# Patient Record
Sex: Male | Born: 1980 | Race: Black or African American | Hispanic: No | Marital: Single | Smoking: Current every day smoker
Health system: Southern US, Community
[De-identification: ages and names within clinical notes are randomized; demographics above are authoritative.]

## PROBLEM LIST (undated history)

## (undated) DIAGNOSIS — Z789 Other specified health status: Secondary | ICD-10-CM

## (undated) HISTORY — PX: ADENOIDECTOMY: SUR15

---

## 2016-06-03 ENCOUNTER — Encounter (HOSPITAL_COMMUNITY): Admission: EM | Disposition: A | Discharge status: Home / Self Care | Payer: Self-pay | Source: Home / Self Care

## 2016-06-03 ENCOUNTER — Emergency Department (HOSPITAL_COMMUNITY): Payer: No Typology Code available for payment source | Admitting: Anesthesiology

## 2016-06-03 ENCOUNTER — Inpatient Hospital Stay (HOSPITAL_COMMUNITY)
Admission: EM | Admit: 2016-06-03 | Discharge: 2016-06-09 | DRG: 580 | Disposition: A | Discharge status: Home / Self Care | Payer: No Typology Code available for payment source | Attending: Surgery | Admitting: Surgery

## 2016-06-03 ENCOUNTER — Encounter (HOSPITAL_COMMUNITY): Payer: Self-pay

## 2016-06-03 ENCOUNTER — Emergency Department (HOSPITAL_COMMUNITY): Payer: No Typology Code available for payment source

## 2016-06-03 ENCOUNTER — Other Ambulatory Visit: Payer: Self-pay

## 2016-06-03 DIAGNOSIS — R7989 Other specified abnormal findings of blood chemistry: Secondary | ICD-10-CM

## 2016-06-03 DIAGNOSIS — S31113A Laceration without foreign body of abdominal wall, right lower quadrant without penetration into peritoneal cavity, initial encounter: Secondary | ICD-10-CM | POA: Diagnosis present

## 2016-06-03 DIAGNOSIS — F17210 Nicotine dependence, cigarettes, uncomplicated: Secondary | ICD-10-CM | POA: Diagnosis present

## 2016-06-03 DIAGNOSIS — S31119A Laceration without foreign body of abdominal wall, unspecified quadrant without penetration into peritoneal cavity, initial encounter: Secondary | ICD-10-CM | POA: Diagnosis present

## 2016-06-03 DIAGNOSIS — R740 Nonspecific elevation of levels of transaminase and lactic acid dehydrogenase [LDH]: Secondary | ICD-10-CM | POA: Diagnosis present

## 2016-06-03 DIAGNOSIS — Y908 Blood alcohol level of 240 mg/100 ml or more: Secondary | ICD-10-CM | POA: Diagnosis present

## 2016-06-03 DIAGNOSIS — T148XXA Other injury of unspecified body region, initial encounter: Secondary | ICD-10-CM | POA: Diagnosis present

## 2016-06-03 DIAGNOSIS — F10120 Alcohol abuse with intoxication, uncomplicated: Secondary | ICD-10-CM | POA: Diagnosis present

## 2016-06-03 DIAGNOSIS — N179 Acute kidney failure, unspecified: Secondary | ICD-10-CM | POA: Diagnosis not present

## 2016-06-03 DIAGNOSIS — S36899A Unspecified injury of other intra-abdominal organs, initial encounter: Secondary | ICD-10-CM | POA: Diagnosis present

## 2016-06-03 DIAGNOSIS — F10920 Alcohol use, unspecified with intoxication, uncomplicated: Secondary | ICD-10-CM

## 2016-06-03 DIAGNOSIS — D62 Acute posthemorrhagic anemia: Secondary | ICD-10-CM

## 2016-06-03 HISTORY — PX: LAPAROTOMY: SHX154

## 2016-06-03 HISTORY — PX: EXPLORATORY LAPAROTOMY: SUR591

## 2016-06-03 HISTORY — DX: Other specified health status: Z78.9

## 2016-06-03 LAB — PREPARE FRESH FROZEN PLASMA
UNIT DIVISION: 0
Unit division: 0

## 2016-06-03 LAB — COMPREHENSIVE METABOLIC PANEL
ALBUMIN: 3.8 g/dL (ref 3.5–5.0)
ALBUMIN: 3.8 g/dL (ref 3.5–5.0)
ALK PHOS: 49 U/L (ref 38–126)
ALT: 18 U/L (ref 17–63)
ALT: 17 U/L (ref 17–63)
ANION GAP: 9 (ref 5–15)
ANION GAP: 9 (ref 5–15)
AST: 21 U/L (ref 15–41)
AST: 22 U/L (ref 15–41)
Alkaline Phosphatase: 51 U/L (ref 38–126)
BUN: 11 mg/dL (ref 6–20)
BUN: 10 mg/dL (ref 6–20)
CALCIUM: 8.3 mg/dL — AB (ref 8.9–10.3)
CHLORIDE: 105 mmol/L (ref 101–111)
CHLORIDE: 103 mmol/L (ref 101–111)
CO2: 24 mmol/L (ref 22–32)
CO2: 27 mmol/L (ref 22–32)
Calcium: 8.7 mg/dL — ABNORMAL LOW (ref 8.9–10.3)
Creatinine, Ser: 1.21 mg/dL (ref 0.61–1.24)
Creatinine, Ser: 1.21 mg/dL (ref 0.61–1.24)
GFR calc non Af Amer: >60 mL/min (ref >60)
GFR calc non Af Amer: >60 mL/min (ref >60)
GLUCOSE: 148 mg/dL — AB (ref 65–99)
Glucose, Bld: 113 mg/dL — ABNORMAL HIGH (ref 65–99)
POTASSIUM: 3.9 mmol/L (ref 3.5–5.1)
Potassium: 3.5 mmol/L (ref 3.5–5.1)
SODIUM: 138 mmol/L (ref 135–145)
SODIUM: 139 mmol/L (ref 135–145)
Total Bilirubin: 0.3 mg/dL (ref 0.3–1.2)
Total Bilirubin: 0.2 mg/dL — ABNORMAL LOW (ref 0.3–1.2)
Total Protein: 6.9 g/dL (ref 6.5–8.1)
Total Protein: 6.8 g/dL (ref 6.5–8.1)

## 2016-06-03 LAB — BPAM FFP
BLOOD PRODUCT EXPIRATION DATE: 201805302359
Blood Product Expiration Date: 201805302359
ISSUE DATE / TIME: 201805280055
ISSUE DATE / TIME: 201805280055
UNIT TYPE AND RH: 6200
Unit Type and Rh: 6200

## 2016-06-03 LAB — I-STAT CHEM 8, ED
BUN: 14 mg/dL (ref 6–20)
CALCIUM ION: 1.02 mmol/L — AB (ref 1.15–1.40)
Chloride: 103 mmol/L (ref 101–111)
Creatinine, Ser: 1.5 mg/dL — ABNORMAL HIGH (ref 0.61–1.24)
GLUCOSE: 110 mg/dL — AB (ref 65–99)
HEMATOCRIT: 39 % (ref 39.0–52.0)
HEMOGLOBIN: 13.3 g/dL (ref 13.0–17.0)
POTASSIUM: 4.1 mmol/L (ref 3.5–5.1)
Sodium: 140 mmol/L (ref 135–145)
TCO2: 26 mmol/L (ref 0–100)

## 2016-06-03 LAB — ETHANOL: Alcohol, Ethyl (B): 250 mg/dL — ABNORMAL HIGH (ref <5)

## 2016-06-03 LAB — CBC
HCT: 37.7 % — ABNORMAL LOW (ref 39.0–52.0)
HEMOGLOBIN: 12.2 g/dL — AB (ref 13.0–17.0)
MCH: 23.7 pg — AB (ref 26.0–34.0)
MCHC: 32.4 g/dL (ref 30.0–36.0)
MCV: 73.2 fL — ABNORMAL LOW (ref 78.0–100.0)
PLATELETS: 151 10*3/uL (ref 150–400)
RBC: 5.15 MIL/uL (ref 4.22–5.81)
RDW: 15 % (ref 11.5–15.5)
WBC: 12 10*3/uL — ABNORMAL HIGH (ref 4.0–10.5)

## 2016-06-03 LAB — PROTIME-INR
INR: 0.98
Prothrombin Time: 13 seconds (ref 11.4–15.2)

## 2016-06-03 LAB — I-STAT CG4 LACTIC ACID, ED: LACTIC ACID, VENOUS: 2.67 mmol/L — AB (ref 0.5–1.9)

## 2016-06-03 LAB — BLOOD PRODUCT ORDER (VERBAL) VERIFICATION

## 2016-06-03 LAB — HIV ANTIBODY (ROUTINE TESTING W REFLEX): HIV SCREEN 4TH GENERATION: NONREACTIVE

## 2016-06-03 LAB — CDS SEROLOGY

## 2016-06-03 LAB — ABO/RH: ABO/RH(D): B POS

## 2016-06-03 SURGERY — LAPAROTOMY, EXPLORATORY
Anesthesia: General | Site: Abdomen

## 2016-06-03 MED ORDER — MIDAZOLAM HCL 2 MG/2ML IJ SOLN: 0.5000 mg | Freq: Once | INTRAMUSCULAR | Status: DC | PRN

## 2016-06-03 MED ORDER — KCL IN DEXTROSE-NACL 20-5-0.9 MEQ/L-%-% IV SOLN
INTRAVENOUS | Status: DC
  Administered 2016-06-03 – 2016-06-06 (×9): via INTRAVENOUS
  Administered 2016-06-07: 10 mL/h via INTRAVENOUS
  Filled 2016-06-03 (×13): qty 1000

## 2016-06-03 MED ORDER — MEPERIDINE HCL 25 MG/ML IJ SOLN: 6.2500 mg | INTRAMUSCULAR | Status: DC | PRN

## 2016-06-03 MED ORDER — HYDROMORPHONE HCL 1 MG/ML IJ SOLN
0.2500 mg | INTRAMUSCULAR | Status: DC | PRN
  Administered 2016-06-03 (×2): 0.5 mg via INTRAVENOUS

## 2016-06-03 MED ORDER — WHITE PETROLATUM GEL
Status: AC
  Administered 2016-06-03: 13:00:00
  Filled 2016-06-03: qty 1

## 2016-06-03 MED ORDER — ONDANSETRON HCL 4 MG/2ML IJ SOLN
INTRAMUSCULAR | Status: DC | PRN
  Administered 2016-06-03: 4 mg via INTRAVENOUS

## 2016-06-03 MED ORDER — PROPOFOL 10 MG/ML IV BOLUS
INTRAVENOUS | Status: DC | PRN
  Administered 2016-06-03: 140 mg via INTRAVENOUS

## 2016-06-03 MED ORDER — ONDANSETRON HCL 4 MG/2ML IJ SOLN
INTRAMUSCULAR | Status: AC
  Filled 2016-06-03: qty 2

## 2016-06-03 MED ORDER — CEFAZOLIN SODIUM 1 G IJ SOLR
INTRAMUSCULAR | Status: AC
  Filled 2016-06-03: qty 20

## 2016-06-03 MED ORDER — FENTANYL CITRATE (PF) 250 MCG/5ML IJ SOLN
INTRAMUSCULAR | Status: DC | PRN
  Administered 2016-06-03: 150 ug via INTRAVENOUS

## 2016-06-03 MED ORDER — ONDANSETRON HCL 4 MG/2ML IJ SOLN
4.0000 mg | Freq: Four times a day (QID) | INTRAMUSCULAR | Status: DC | PRN
  Administered 2016-06-04 – 2016-06-05 (×2): 4 mg via INTRAVENOUS

## 2016-06-03 MED ORDER — EPHEDRINE SULFATE 50 MG/ML IJ SOLN
INTRAMUSCULAR | Status: DC | PRN
  Administered 2016-06-03: 10 mg via INTRAVENOUS

## 2016-06-03 MED ORDER — TETANUS-DIPHTH-ACELL PERTUSSIS 5-2.5-18.5 LF-MCG/0.5 IM SUSP: 0.5000 mL | Freq: Once | INTRAMUSCULAR | Status: DC

## 2016-06-03 MED ORDER — EPHEDRINE 5 MG/ML INJ
INTRAVENOUS | Status: AC
  Filled 2016-06-03: qty 10

## 2016-06-03 MED ORDER — ONDANSETRON HCL 4 MG PO TABS: 4.0000 mg | ORAL_TABLET | Freq: Four times a day (QID) | ORAL | Status: DC | PRN

## 2016-06-03 MED ORDER — LIDOCAINE 2% (20 MG/ML) 5 ML SYRINGE
INTRAMUSCULAR | Status: AC
  Filled 2016-06-03: qty 5

## 2016-06-03 MED ORDER — ONDANSETRON HCL 4 MG/2ML IJ SOLN
4.0000 mg | Freq: Four times a day (QID) | INTRAMUSCULAR | Status: DC | PRN
  Filled 2016-06-03 (×3): qty 2

## 2016-06-03 MED ORDER — HYDROMORPHONE 1 MG/ML IV SOLN
INTRAVENOUS | Status: DC
  Administered 2016-06-03: 1 mg via INTRAVENOUS
  Administered 2016-06-03: 0.4 mg via INTRAVENOUS
  Administered 2016-06-03: 0.2 mg via INTRAVENOUS
  Administered 2016-06-03: 03:00:00 via INTRAVENOUS
  Administered 2016-06-04: 0.4 mg via INTRAVENOUS
  Administered 2016-06-04: 0.6 mg via INTRAVENOUS
  Administered 2016-06-04: 0.2 mg via INTRAVENOUS
  Administered 2016-06-04: 0.4 mg via INTRAVENOUS
  Administered 2016-06-04: 2.6 mg via INTRAVENOUS
  Administered 2016-06-04 – 2016-06-05 (×2): 0.2 mg via INTRAVENOUS
  Administered 2016-06-05: 0.4 mg via INTRAVENOUS
  Filled 2016-06-03: qty 25

## 2016-06-03 MED ORDER — NALOXONE HCL 0.4 MG/ML IJ SOLN: 0.4000 mg | INTRAMUSCULAR | Status: DC | PRN

## 2016-06-03 MED ORDER — ROCURONIUM BROMIDE 10 MG/ML (PF) SYRINGE
PREFILLED_SYRINGE | INTRAVENOUS | Status: AC
  Filled 2016-06-03: qty 5

## 2016-06-03 MED ORDER — HYDROMORPHONE HCL 1 MG/ML IJ SOLN
INTRAMUSCULAR | Status: AC
  Administered 2016-06-03: 0.5 mg via INTRAVENOUS
  Filled 2016-06-03: qty 1

## 2016-06-03 MED ORDER — FENTANYL CITRATE (PF) 250 MCG/5ML IJ SOLN
INTRAMUSCULAR | Status: AC
  Filled 2016-06-03: qty 5

## 2016-06-03 MED ORDER — SUCCINYLCHOLINE CHLORIDE 200 MG/10ML IV SOSY
PREFILLED_SYRINGE | INTRAVENOUS | Status: AC
  Filled 2016-06-03: qty 10

## 2016-06-03 MED ORDER — SUGAMMADEX SODIUM 200 MG/2ML IV SOLN
INTRAVENOUS | Status: DC | PRN
  Administered 2016-06-03: 200 mg via INTRAVENOUS

## 2016-06-03 MED ORDER — DEXAMETHASONE SODIUM PHOSPHATE 10 MG/ML IJ SOLN
INTRAMUSCULAR | Status: DC | PRN
  Administered 2016-06-03: 10 mg via INTRAVENOUS

## 2016-06-03 MED ORDER — SUGAMMADEX SODIUM 200 MG/2ML IV SOLN
INTRAVENOUS | Status: AC
  Filled 2016-06-03: qty 2

## 2016-06-03 MED ORDER — DIPHENHYDRAMINE HCL 50 MG/ML IJ SOLN: 12.5000 mg | Freq: Four times a day (QID) | INTRAMUSCULAR | Status: DC | PRN

## 2016-06-03 MED ORDER — LACTATED RINGERS IV SOLN
INTRAVENOUS | Status: DC | PRN
  Administered 2016-06-03: 01:00:00 via INTRAVENOUS

## 2016-06-03 MED ORDER — MIDAZOLAM HCL 2 MG/2ML IJ SOLN
INTRAMUSCULAR | Status: AC
  Filled 2016-06-03: qty 2

## 2016-06-03 MED ORDER — SODIUM CHLORIDE 0.9 % IV SOLN
INTRAVENOUS | Status: DC | PRN
  Administered 2016-06-03: 02:00:00 via INTRAVENOUS

## 2016-06-03 MED ORDER — SODIUM CHLORIDE 0.9% FLUSH: 9.0000 mL | INTRAVENOUS | Status: DC | PRN

## 2016-06-03 MED ORDER — 0.9 % SODIUM CHLORIDE (POUR BTL) OPTIME
TOPICAL | Status: DC | PRN
  Administered 2016-06-03: 1000 mL

## 2016-06-03 MED ORDER — PROMETHAZINE HCL 25 MG/ML IJ SOLN: 6.2500 mg | INTRAMUSCULAR | Status: DC | PRN

## 2016-06-03 MED ORDER — DEXAMETHASONE SODIUM PHOSPHATE 10 MG/ML IJ SOLN
INTRAMUSCULAR | Status: AC
  Filled 2016-06-03: qty 1

## 2016-06-03 MED ORDER — MIDAZOLAM HCL 5 MG/5ML IJ SOLN
INTRAMUSCULAR | Status: DC | PRN
  Administered 2016-06-03: 2 mg via INTRAVENOUS

## 2016-06-03 MED ORDER — PROPOFOL 10 MG/ML IV BOLUS
INTRAVENOUS | Status: AC
  Filled 2016-06-03: qty 20

## 2016-06-03 MED ORDER — ENOXAPARIN SODIUM 40 MG/0.4ML ~~LOC~~ SOLN
40.0000 mg | SUBCUTANEOUS | Status: DC
  Administered 2016-06-04 – 2016-06-08 (×5): 40 mg via SUBCUTANEOUS
  Filled 2016-06-03 (×6): qty 0.4

## 2016-06-03 MED ORDER — CEFAZOLIN SODIUM 1 G IJ SOLR
INTRAMUSCULAR | Status: DC | PRN
  Administered 2016-06-03: 2 g via INTRAMUSCULAR

## 2016-06-03 MED ORDER — SUCCINYLCHOLINE CHLORIDE 20 MG/ML IJ SOLN
INTRAMUSCULAR | Status: DC | PRN
  Administered 2016-06-03: 140 mg via INTRAVENOUS

## 2016-06-03 MED ORDER — HYDROMORPHONE HCL 1 MG/ML IJ SOLN: 1.0000 mg | INTRAMUSCULAR | Status: DC | PRN

## 2016-06-03 MED ORDER — ROCURONIUM BROMIDE 100 MG/10ML IV SOLN
INTRAVENOUS | Status: DC | PRN
Start: 2016-06-03 — End: 2016-06-03
  Administered 2016-06-03: 30 mg via INTRAVENOUS

## 2016-06-03 MED ORDER — DIPHENHYDRAMINE HCL 12.5 MG/5ML PO ELIX: 12.5000 mg | ORAL_SOLUTION | Freq: Four times a day (QID) | ORAL | Status: DC | PRN

## 2016-06-03 MED ORDER — LIDOCAINE HCL (CARDIAC) 20 MG/ML IV SOLN
INTRAVENOUS | Status: DC | PRN
  Administered 2016-06-03: 100 mg via INTRATRACHEAL

## 2016-06-03 SURGICAL SUPPLY — 41 items
BLADE CLIPPER SURG (BLADE) IMPLANT
CANISTER SUCT 3000ML PPV (MISCELLANEOUS) ×3 IMPLANT
CHLORAPREP W/TINT 26ML (MISCELLANEOUS) ×3 IMPLANT
COVER SURGICAL LIGHT HANDLE (MISCELLANEOUS) ×3 IMPLANT
DRAPE LAPAROSCOPIC ABDOMINAL (DRAPES) ×3 IMPLANT
DRAPE WARM FLUID 44X44 (DRAPE) ×3 IMPLANT
DRSG OPSITE POSTOP 3X4 (GAUZE/BANDAGES/DRESSINGS) ×3 IMPLANT
DRSG OPSITE POSTOP 4X10 (GAUZE/BANDAGES/DRESSINGS) ×3 IMPLANT
DRSG OPSITE POSTOP 4X8 (GAUZE/BANDAGES/DRESSINGS) IMPLANT
ELECT BLADE 6.5 EXT (BLADE) ×3 IMPLANT
ELECT CAUTERY BLADE 6.4 (BLADE) ×3 IMPLANT
ELECT REM PT RETURN 9FT ADLT (ELECTROSURGICAL) ×3
ELECTRODE REM PT RTRN 9FT ADLT (ELECTROSURGICAL) ×1 IMPLANT
GLOVE BIO SURGEON STRL SZ8 (GLOVE) ×3 IMPLANT
GLOVE BIOGEL PI IND STRL 8 (GLOVE) ×1 IMPLANT
GLOVE BIOGEL PI INDICATOR 8 (GLOVE) ×2
GOWN STRL REUS W/ TWL LRG LVL3 (GOWN DISPOSABLE) ×1 IMPLANT
GOWN STRL REUS W/ TWL XL LVL3 (GOWN DISPOSABLE) ×1 IMPLANT
GOWN STRL REUS W/TWL LRG LVL3 (GOWN DISPOSABLE) ×2
GOWN STRL REUS W/TWL XL LVL3 (GOWN DISPOSABLE) ×2
HANDLE SUCTION POOLE (INSTRUMENTS) ×1 IMPLANT
KIT BASIN OR (CUSTOM PROCEDURE TRAY) ×3 IMPLANT
KIT ROOM TURNOVER OR (KITS) ×3 IMPLANT
LIGASURE IMPACT 36 18CM CVD LR (INSTRUMENTS) IMPLANT
NS IRRIG 1000ML POUR BTL (IV SOLUTION) ×6 IMPLANT
PACK GENERAL/GYN (CUSTOM PROCEDURE TRAY) ×3 IMPLANT
PAD ARMBOARD 7.5X6 YLW CONV (MISCELLANEOUS) ×3 IMPLANT
SPECIMEN JAR LARGE (MISCELLANEOUS) IMPLANT
SPONGE LAP 18X18 X RAY DECT (DISPOSABLE) ×3 IMPLANT
STAPLER VISISTAT 35W (STAPLE) ×6 IMPLANT
SUCTION POOLE HANDLE (INSTRUMENTS) ×3
SUCTION POOLE TIP (SUCTIONS) ×3 IMPLANT
SUT PDS AB 1 TP1 96 (SUTURE) ×12 IMPLANT
SUT VIC AB 2-0 SH 18 (SUTURE) ×6 IMPLANT
SUT VIC AB 3-0 SH 18 (SUTURE) ×6 IMPLANT
SUT VICRYL AB 2 0 TIES (SUTURE) ×3 IMPLANT
SUT VICRYL AB 3 0 TIES (SUTURE) ×3 IMPLANT
TOWEL OR 17X24 6PK STRL BLUE (TOWEL DISPOSABLE) ×3 IMPLANT
TOWEL OR 17X26 10 PK STRL BLUE (TOWEL DISPOSABLE) ×3 IMPLANT
TRAY FOLEY W/METER SILVER 16FR (SET/KITS/TRAYS/PACK) IMPLANT
YANKAUER SUCT BULB TIP NO VENT (SUCTIONS) IMPLANT
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
## 2016-06-03 NOTE — ED Notes (Signed)
Cronett at bedside
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
## 2016-06-03 NOTE — ED Notes (Signed)
Valuables loced up with security, belongings sent with patient
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
## 2016-06-03 NOTE — Anesthesia Postprocedure Evaluation (Signed)
Anesthesia Post Note  Patient: Lawrence Curry  Procedure(s) Performed: Procedure(s) (LRB): EXPLORATORY LAPAROTOMY (N/A)  Patient location during evaluation: PACU Anesthesia Type: General Level of consciousness: sedated, oriented and patient cooperative Pain management: pain level controlled Vital Signs Assessment: post-procedure vital signs reviewed and stable Respiratory status: spontaneous breathing, nonlabored ventilation, respiratory function stable and patient connected to nasal cannula oxygen Cardiovascular status: blood pressure returned to baseline and stable Postop Assessment: no signs of nausea or vomiting Anesthetic complications: no       Last Vitals:  Vitals:   06/03/16 0345 06/03/16 0405  BP: 136/80 135/76  Pulse:  83  Resp:  17  Temp: 36.3 C 37 C    Last Pain:  Vitals:   06/03/16 0405  TempSrc: Oral  PainSc:                  Teale Goodgame,E. Cate Oravec
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
## 2016-06-03 NOTE — Transfer of Care (Signed)
Immediate Anesthesia Transfer of Care Note  Patient: Lawrence Curry  Procedure(s) Performed: Procedure(s): EXPLORATORY LAPAROTOMY (N/A)  Patient Location: PACU  Anesthesia Type:General  Level of Consciousness: sedated, drowsy and confused  Airway & Oxygen Therapy: Patient Spontanous Breathing and Patient connected to nasal cannula oxygen  Post-op Assessment: Report given to RN, Post -op Vital signs reviewed and stable and Patient moving all extremities X 4  Post vital signs: Reviewed and stable  Last Vitals:  Vitals:   06/03/16 0113 06/03/16 0115  BP: 127/89 105/66  Pulse: 85 74  Resp: (!) 21 20  Temp:      Last Pain:  Vitals:   06/03/16 0109  PainSc: 7          Complications: No apparent anesthesia complications

## 2016-06-03 NOTE — Anesthesia Procedure Notes (Signed)
Procedure Name: Intubation Date/Time: 06/03/2016 1:32 AM Performed by: Claris Che Pre-anesthesia Checklist: Patient identified, Emergency Drugs available, Suction available, Patient being monitored and Timeout performed Patient Re-evaluated:Patient Re-evaluated prior to inductionOxygen Delivery Method: Circle system utilized Preoxygenation: Pre-oxygenation with 100% oxygen Intubation Type: IV induction, Rapid sequence and Cricoid Pressure applied Laryngoscope Size: Mac and 3 Grade View: Grade II Tube type: Oral Tube size: 8.0 mm Number of attempts: 1 Airway Equipment and Method: Stylet Placement Confirmation: ETT inserted through vocal cords under direct vision,  positive ETCO2 and breath sounds checked- equal and bilateral Secured at: 23 cm Tube secured with: Tape Dental Injury: Teeth and Oropharynx as per pre-operative assessment

## 2016-06-04 ENCOUNTER — Encounter (HOSPITAL_COMMUNITY): Payer: Self-pay | Admitting: Surgery

## 2016-06-04 LAB — BPAM RBC
Blood Product Expiration Date: 201806242359
Blood Product Expiration Date: 201806242359
ISSUE DATE / TIME: 201805280054
ISSUE DATE / TIME: 201805280054
UNIT TYPE AND RH: 9500
Unit Type and Rh: 9500

## 2016-06-04 LAB — URINALYSIS, ROUTINE W REFLEX MICROSCOPIC
BILIRUBIN URINE: NEGATIVE
Glucose, UA: NEGATIVE mg/dL
Hgb urine dipstick: NEGATIVE
KETONES UR: NEGATIVE mg/dL
Leukocytes, UA: NEGATIVE
NITRITE: NEGATIVE
PROTEIN: NEGATIVE mg/dL
Specific Gravity, Urine: 1.016 (ref 1.005–1.030)
pH: 6 (ref 5.0–8.0)

## 2016-06-04 LAB — TYPE AND SCREEN
ABO/RH(D): B POS
Antibody Screen: NEGATIVE
UNIT DIVISION: 0
Unit division: 0

## 2016-06-04 MED ORDER — ORAL CARE MOUTH RINSE
15.0000 mL | Freq: Two times a day (BID) | OROMUCOSAL | Status: DC
  Administered 2016-06-05 – 2016-06-07 (×4): 15 mL via OROMUCOSAL
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
## 2016-06-04 NOTE — Progress Notes (Signed)
Offered to ambulate with patient, he denied at this time due to pain. Will try again this afternoon.

## 2016-06-05 LAB — CBC WITH DIFFERENTIAL/PLATELET
BASOS PCT: 0 %
Basophils Absolute: 0 10*3/uL (ref 0.0–0.1)
EOS ABS: 0.1 10*3/uL (ref 0.0–0.7)
Eosinophils Relative: 1 %
HCT: 38.9 % — ABNORMAL LOW (ref 39.0–52.0)
HEMOGLOBIN: 12.3 g/dL — AB (ref 13.0–17.0)
Lymphocytes Relative: 25 %
Lymphs Abs: 1.9 10*3/uL (ref 0.7–4.0)
MCH: 23.5 pg — AB (ref 26.0–34.0)
MCHC: 31.6 g/dL (ref 30.0–36.0)
MCV: 74.2 fL — ABNORMAL LOW (ref 78.0–100.0)
MONO ABS: 0.6 10*3/uL (ref 0.1–1.0)
Monocytes Relative: 8 %
NEUTROS ABS: 5.1 10*3/uL (ref 1.7–7.7)
Neutrophils Relative %: 66 %
PLATELETS: 151 10*3/uL (ref 150–400)
RBC: 5.24 MIL/uL (ref 4.22–5.81)
RDW: 14.8 % (ref 11.5–15.5)
WBC: 7.7 10*3/uL (ref 4.0–10.5)

## 2016-06-05 LAB — BASIC METABOLIC PANEL
Anion gap: 9 (ref 5–15)
BUN: 5 mg/dL — AB (ref 6–20)
CHLORIDE: 101 mmol/L (ref 101–111)
CO2: 27 mmol/L (ref 22–32)
CREATININE: 1.08 mg/dL (ref 0.61–1.24)
Calcium: 9.2 mg/dL (ref 8.9–10.3)
GFR calc Af Amer: >60 mL/min (ref >60)
GFR calc non Af Amer: >60 mL/min (ref >60)
Glucose, Bld: 114 mg/dL — ABNORMAL HIGH (ref 65–99)
Potassium: 4.3 mmol/L (ref 3.5–5.1)
SODIUM: 137 mmol/L (ref 135–145)

## 2016-06-05 MED ORDER — METHOCARBAMOL 1000 MG/10ML IJ SOLN
500.0000 mg | Freq: Three times a day (TID) | INTRAVENOUS | Status: DC
  Administered 2016-06-05 – 2016-06-09 (×13): 500 mg via INTRAVENOUS
  Filled 2016-06-05 (×20): qty 5

## 2016-06-05 MED ORDER — KETOROLAC TROMETHAMINE 30 MG/ML IJ SOLN
30.0000 mg | Freq: Once | INTRAMUSCULAR | Status: AC
  Administered 2016-06-05: 30 mg via INTRAVENOUS
  Filled 2016-06-05: qty 1

## 2016-06-05 MED ORDER — PHENOL 1.4 % MT LIQD
1.0000 | OROMUCOSAL | Status: DC | PRN
  Administered 2016-06-05: 1 via OROMUCOSAL
  Filled 2016-06-05: qty 177

## 2016-06-05 MED ORDER — KETOROLAC TROMETHAMINE 15 MG/ML IJ SOLN
15.0000 mg | Freq: Four times a day (QID) | INTRAMUSCULAR | Status: AC
  Administered 2016-06-05 – 2016-06-07 (×8): 15 mg via INTRAVENOUS
  Filled 2016-06-05 (×8): qty 1

## 2016-06-05 MED ORDER — HYDROMORPHONE HCL 1 MG/ML IJ SOLN
1.0000 mg | INTRAMUSCULAR | Status: DC | PRN
  Administered 2016-06-05 – 2016-06-06 (×3): 1 mg via INTRAVENOUS
  Administered 2016-06-07: 2 mg via INTRAVENOUS
  Administered 2016-06-07: 1 mg via INTRAVENOUS
  Administered 2016-06-08: 2 mg via INTRAVENOUS
  Administered 2016-06-08: 1 mg via INTRAVENOUS
  Filled 2016-06-05 (×3): qty 1
  Filled 2016-06-05 (×2): qty 2
  Filled 2016-06-05 (×2): qty 1

## 2016-06-05 NOTE — Progress Notes (Signed)
NGT clamped. Advised pt to call at the s/o abd discomfort, distention. N&v.
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
## 2016-06-05 NOTE — Progress Notes (Signed)
Abd: no obvious distention, no discomfort, no n&v.

## 2016-06-05 NOTE — Progress Notes (Signed)
Pt c/o nausea during trial after 3.5 h. vomitted small amount mucoid emesis. Med given, suction restarted LIS. OOB in hall with min assist, walked ~200 ft without difficulty. States that he is passing flatus. Bowel sounds hypoactive.

## 2016-06-05 NOTE — Progress Notes (Signed)
15 ml dilaudid pca wasted in med sink, 6A, with Shanon BrowJoyce Whitaker, RN.

## 2016-06-06 LAB — CBC
HCT: 37.2 % — ABNORMAL LOW (ref 39.0–52.0)
HEMOGLOBIN: 11.6 g/dL — AB (ref 13.0–17.0)
MCH: 23.2 pg — AB (ref 26.0–34.0)
MCHC: 31.2 g/dL (ref 30.0–36.0)
MCV: 74.3 fL — ABNORMAL LOW (ref 78.0–100.0)
Platelets: 150 10*3/uL (ref 150–400)
RBC: 5.01 MIL/uL (ref 4.22–5.81)
RDW: 14.8 % (ref 11.5–15.5)
WBC: 4 10*3/uL (ref 4.0–10.5)

## 2016-06-06 LAB — BASIC METABOLIC PANEL
Anion gap: 9 (ref 5–15)
BUN: 9 mg/dL (ref 6–20)
CALCIUM: 9 mg/dL (ref 8.9–10.3)
CO2: 27 mmol/L (ref 22–32)
CREATININE: 1.1 mg/dL (ref 0.61–1.24)
Chloride: 101 mmol/L (ref 101–111)
GFR calc Af Amer: >60 mL/min (ref >60)
GFR calc non Af Amer: >60 mL/min (ref >60)
GLUCOSE: 108 mg/dL — AB (ref 65–99)
Potassium: 4.2 mmol/L (ref 3.5–5.1)
Sodium: 137 mmol/L (ref 135–145)
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
## 2016-06-07 MED ORDER — POLYETHYLENE GLYCOL 3350 17 G PO PACK
17.0000 g | PACK | Freq: Every day | ORAL | Status: DC
  Administered 2016-06-07 – 2016-06-08 (×2): 17 g via ORAL
  Filled 2016-06-07 (×3): qty 1

## 2016-06-07 MED ORDER — DOCUSATE SODIUM 100 MG PO CAPS: 100.0000 mg | ORAL_CAPSULE | Freq: Two times a day (BID) | ORAL | Status: DC | PRN

## 2016-06-07 NOTE — Discharge Instructions (Signed)
Stitches, Staples, or Adhesive Wound Closure  Doctors use stitches (sutures), staples, and certain glue (skin adhesives) to hold your skin together while it heals (wound closure). You may need this treatment after you have surgery or if you cut your skin accidentally. These methods help your skin heal more quickly. They also make it less likely that you will have a scar.  What are the different kinds of wound closures?  There are many options for wound closure. The one that your doctor uses depends on how deep and large your wound is.  Adhesive Glue  To use this glue to close a wound, your doctor holds the edges of the wound together and paints the glue on the surface of your skin. You may need more than one layer of glue. Then the wound may be covered with a light bandage (dressing).  This type of skin closure may be used for small wounds that are not deep (superficial). Using glue for wound closure is less painful than other methods. It does not require a medicine that numbs the area. This method also leaves nothing to be removed. Adhesive glue is often used for children and on facial wounds.  Adhesive glue cannot be used for wounds that are deep, uneven, or bleeding. It is not used inside of a wound.  Adhesive Strips  These strips are made of sticky (adhesive), porous paper. They are placed across your skin edges like a regular adhesive bandage. You leave them on until they fall off.  Adhesive strips may be used to close very superficial wounds. They may also be used along with sutures to improve closure of your skin edges.  Sutures  Sutures are the oldest method of wound closure. Sutures can be made from natural or synthetic materials. They can be made from a material that your body can break down as your wound heals (absorbable), or they can be made from a material that needs to be removed from your skin (nonabsorbable). They come in many different strengths and sizes.  Your doctor attaches the sutures to a  steel needle on one end. Sutures can be passed through your skin, or through the tissues beneath your skin. Then they are tied and cut. Your skin edges may be closed in one continuous stitch or in separate stitches.  Sutures are strong and can be used for all kinds of wounds. Absorbable sutures may be used to close tissues under the skin. The disadvantage of sutures is that they may cause skin reactions that lead to infection. Nonabsorbable sutures need to be removed.  Staples  When surgical staples are used to close a wound, the edges of your skin on both sides of the wound are brought close together. A staple is placed across the wound, and an instrument secures the edges together. Staples are often used to close surgical cuts (incisions).  Staples are faster to use than sutures, and they cause less reaction from your skin. Staples need to be removed using a tool that bends the staples away from your skin.  How do I care for my wound closure?   Take medicines only as told by your doctor.   If you were prescribed an antibiotic medicine for your wound, finish it all even if you start to feel better.   Use ointments or creams only as told by your doctor.   Wash your hands with soap and water before and after touching your wound.   Do not soak your wound in water. Do   not take baths, swim, or use a hot tub until your doctor says it is okay.   Ask your doctor when you can start showering. Cover your wound if told by your doctor.   Do not take out your own sutures or staples.   Do not pick at your wound. Picking can cause an infection.   Keep all follow-up visits as told by your doctor. This is important.  How long will I have my wound closure?   Leave adhesive glue on your skin until the glue peels away.   Leave adhesive strips on your skin until they fall off.   Absorbable sutures will dissolve within several days.   Nonabsorbable sutures and staples must be removed. The location of the wound will  determine how long they stay in. This can range from several days to a couple of weeks.  When should I seek help for my wound closure?  Contact your doctor if:   You have a fever.   You have chills.   You have redness, puffiness (swelling), or pain at the site of your wound.   You have fluid, blood, or pus coming from your wound.   There is a bad smell coming from your wound.   The skin edges of your wound start to separate after your sutures have been removed.   Your wound becomes thick, raised, and darker in color after your sutures come out (scarring).    This information is not intended to replace advice given to you by your health care provider. Make sure you discuss any questions you have with your health care provider.  Document Released: 10/21/2008 Document Revised: 08/23/2015 Document Reviewed: 06/02/2013  Elsevier Interactive Patient Education  2018 Elsevier Inc[employer]
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
## 2016-06-09 MED ORDER — HYDROCODONE-ACETAMINOPHEN 5-325 MG PO TABS: 1.0000 | ORAL_TABLET | Freq: Four times a day (QID) | ORAL | 0 refills | Status: AC | PRN

## 2016-06-09 NOTE — Progress Notes (Signed)
Discharge instructions reviewed with patient. Prescription given. Follow up appointments are made. Valuables from security returned to patient. Diet, activity, incisional care, and bowel regimen gone over. Signs and symptoms of infection and reasons to call the doctor discussed. Patient verbalized understanding of instructions. There were no home medications to reconcile.

## 2016-06-21 ENCOUNTER — Telehealth (HOSPITAL_COMMUNITY): Payer: Self-pay

## 2016-06-21 NOTE — Telephone Encounter (Signed)
Pt re-scheduled for Thursday 06/27/16 at 10 AM. Please arrive by 9:45 AM. I have called and notified the patient.

## 2016-06-21 NOTE — Telephone Encounter (Signed)
[phone] Lora missed appt. Yesterday and needs to reschedule.
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
